# Patient Record
Sex: Male | Born: 1945 | Race: White | Hispanic: No | Marital: Married | Smoking: Never smoker
Health system: Southern US, Community
[De-identification: ages and names within clinical notes are randomized; demographics above are authoritative.]

## PROBLEM LIST (undated history)

## (undated) DIAGNOSIS — E785 Hyperlipidemia, unspecified: Secondary | ICD-10-CM

## (undated) DIAGNOSIS — I219 Acute myocardial infarction, unspecified: Secondary | ICD-10-CM

## (undated) DIAGNOSIS — E119 Type 2 diabetes mellitus without complications: Secondary | ICD-10-CM

## (undated) DIAGNOSIS — I10 Essential (primary) hypertension: Secondary | ICD-10-CM

## (undated) HISTORY — PX: CARDIAC CATHETERIZATION: SHX172

## (undated) HISTORY — PX: ANKLE SURGERY: SHX546

---

## 2004-10-03 ENCOUNTER — Ambulatory Visit: Payer: Self-pay

## 2011-03-23 ENCOUNTER — Inpatient Hospital Stay: Payer: Self-pay | Admitting: Family Medicine

## 2011-03-27 ENCOUNTER — Observation Stay: Payer: Self-pay | Admitting: Family Medicine

## 2011-04-15 ENCOUNTER — Emergency Department: Payer: Self-pay | Admitting: Emergency Medicine

## 2011-04-19 ENCOUNTER — Encounter: Payer: Self-pay | Admitting: Cardiology

## 2011-05-13 ENCOUNTER — Encounter: Payer: Self-pay | Admitting: Cardiology

## 2011-06-13 ENCOUNTER — Encounter: Payer: Self-pay | Admitting: Cardiology

## 2011-06-23 ENCOUNTER — Inpatient Hospital Stay: Payer: Self-pay | Admitting: Family Medicine

## 2011-06-23 LAB — COMPREHENSIVE METABOLIC PANEL
Albumin: 3.7 g/dL (ref 3.4–5.0)
Alkaline Phosphatase: 71 U/L (ref 50–136)
Anion Gap: 11 (ref 7–16)
BUN: 13 mg/dL (ref 7–18)
Calcium, Total: 8.8 mg/dL (ref 8.5–10.1)
Glucose: 126 mg/dL — ABNORMAL HIGH (ref 65–99)
Potassium: 3.7 mmol/L (ref 3.5–5.1)
SGOT(AST): 29 U/L (ref 15–37)
SGPT (ALT): 28 U/L
Sodium: 141 mmol/L (ref 136–145)
Total Protein: 6.5 g/dL (ref 6.4–8.2)

## 2011-06-23 LAB — CBC
HCT: 38.1 % — ABNORMAL LOW (ref 40.0–52.0)
HGB: 12.7 g/dL — ABNORMAL LOW (ref 13.0–18.0)
MCH: 29.4 pg (ref 26.0–34.0)
Platelet: 156 10*3/uL (ref 150–440)
RBC: 4.31 10*6/uL — ABNORMAL LOW (ref 4.40–5.90)
WBC: 5.8 10*3/uL (ref 3.8–10.6)

## 2011-06-23 LAB — CK TOTAL AND CKMB (NOT AT ARMC): CK, Total: 217 U/L (ref 35–232)

## 2011-06-23 LAB — TROPONIN I: Troponin-I: <0.02 ng/mL

## 2011-06-24 LAB — CBC WITH DIFFERENTIAL/PLATELET
Basophil %: 0.4 %
Eosinophil #: 0.1 10*3/uL (ref 0.0–0.7)
HCT: 35.8 % — ABNORMAL LOW (ref 40.0–52.0)
HGB: 12.1 g/dL — ABNORMAL LOW (ref 13.0–18.0)
Lymphocyte %: 17.9 %
MCH: 29.5 pg (ref 26.0–34.0)
MCHC: 33.7 g/dL (ref 32.0–36.0)
Monocyte #: 0.9 10*3/uL — ABNORMAL HIGH (ref 0.0–0.7)
Neutrophil #: 5.3 10*3/uL (ref 1.4–6.5)
Neutrophil %: 69.1 %
Platelet: 137 10*3/uL — ABNORMAL LOW (ref 150–440)
RBC: 4.09 10*6/uL — ABNORMAL LOW (ref 4.40–5.90)

## 2011-06-24 LAB — BASIC METABOLIC PANEL
Anion Gap: 10 (ref 7–16)
BUN: 12 mg/dL (ref 7–18)
Chloride: 103 mmol/L (ref 98–107)
Creatinine: 1.03 mg/dL (ref 0.60–1.30)
EGFR (Non-African Amer.): >60
Potassium: 3.8 mmol/L (ref 3.5–5.1)
Sodium: 140 mmol/L (ref 136–145)

## 2011-06-24 LAB — CK TOTAL AND CKMB (NOT AT ARMC)
CK, Total: 185 U/L (ref 35–232)
CK-MB: 1.3 ng/mL (ref 0.5–3.6)

## 2011-06-25 LAB — BASIC METABOLIC PANEL
Anion Gap: 9 (ref 7–16)
BUN: 7 mg/dL (ref 7–18)
Calcium, Total: 8.2 mg/dL — ABNORMAL LOW (ref 8.5–10.1)
Co2: 29 mmol/L (ref 21–32)
EGFR (African American): >60
EGFR (Non-African Amer.): >60
Glucose: 112 mg/dL — ABNORMAL HIGH (ref 65–99)
Osmolality: 280 (ref 275–301)
Potassium: 3.9 mmol/L (ref 3.5–5.1)
Sodium: 141 mmol/L (ref 136–145)

## 2011-06-25 LAB — HEMOGLOBIN: HGB: 12.3 g/dL — ABNORMAL LOW (ref 13.0–18.0)

## 2011-06-25 LAB — PLATELET COUNT: Platelet: 135 10*3/uL — ABNORMAL LOW (ref 150–440)

## 2013-05-27 IMAGING — US US CAROTID DUPLEX BILAT
1 series · 14 of 24 positions shown · non-contrast
Comparison: none

REASON FOR EXAM: syncope
COMMENTS:

[Series 1: us carotid duplex bilat · 0.08mm/px · 14 of 67 slices shown]
[im 1/67]
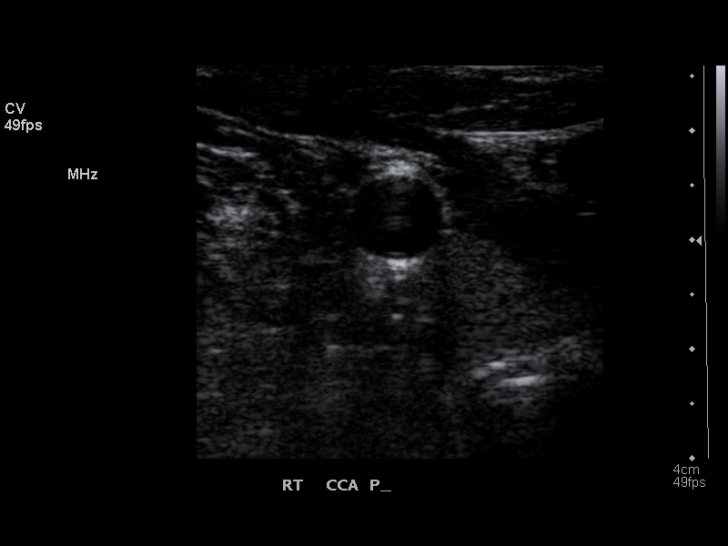
[im 6/67]
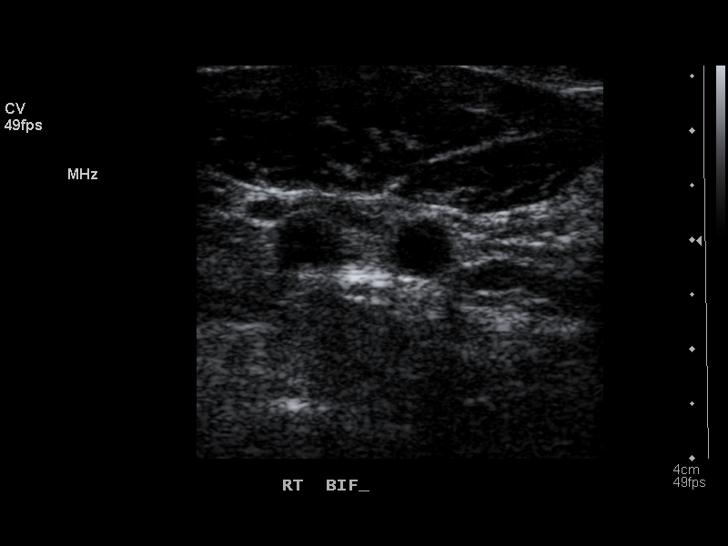
[im 12/67]
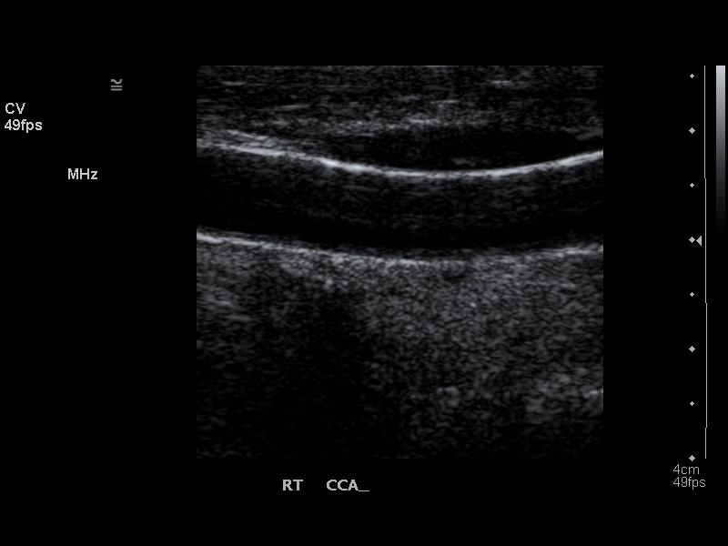
[im 18/67]
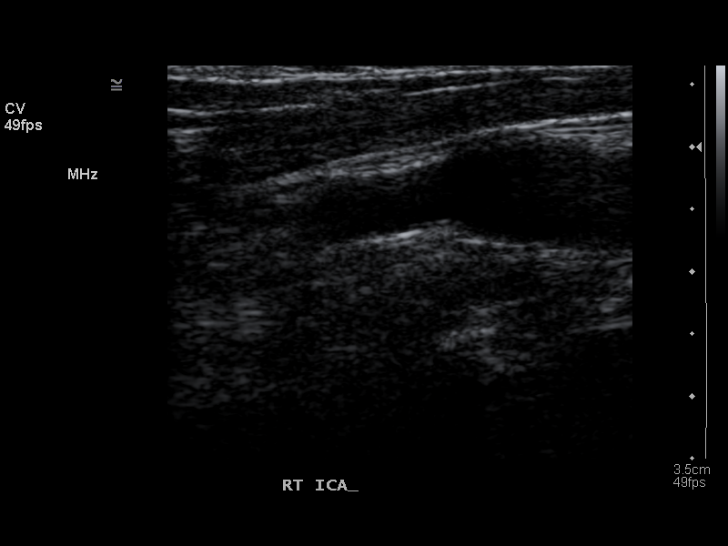
[im 21/67]
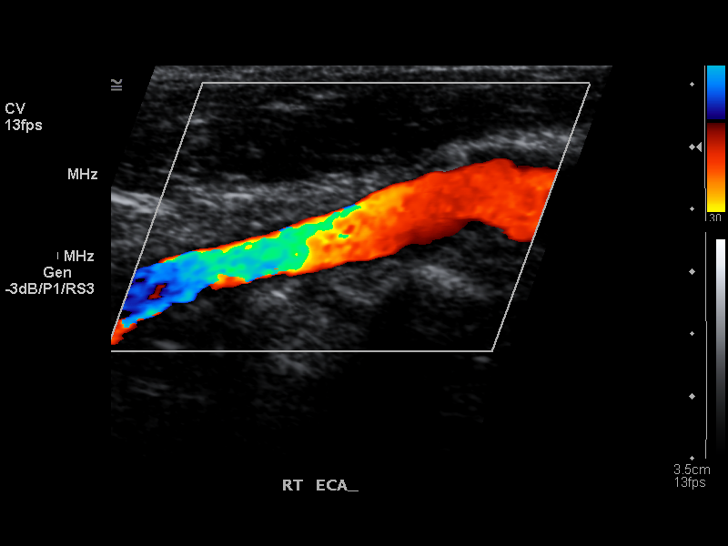
[im 26/67]
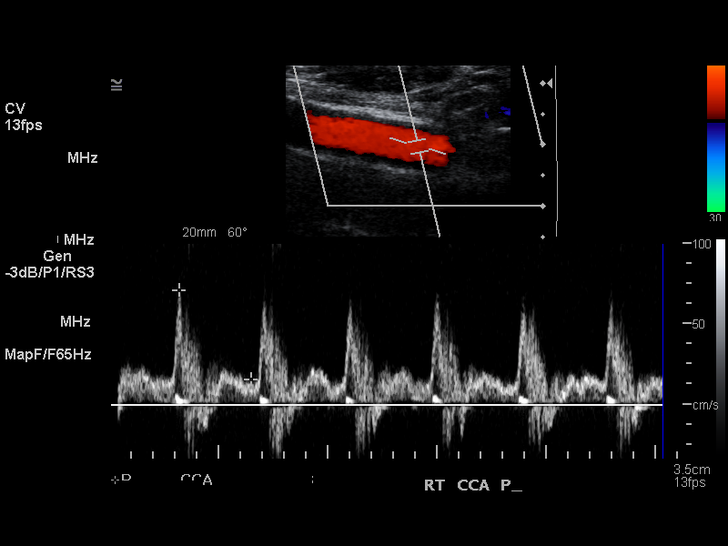
[im 32/67]
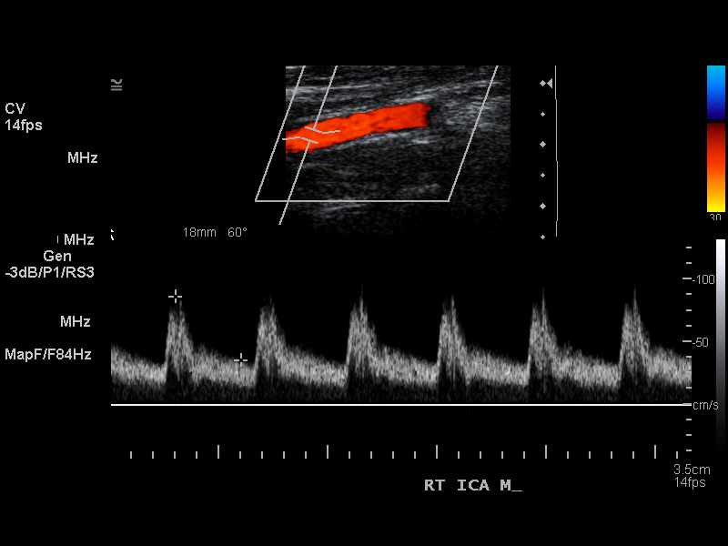
[im 35/67]
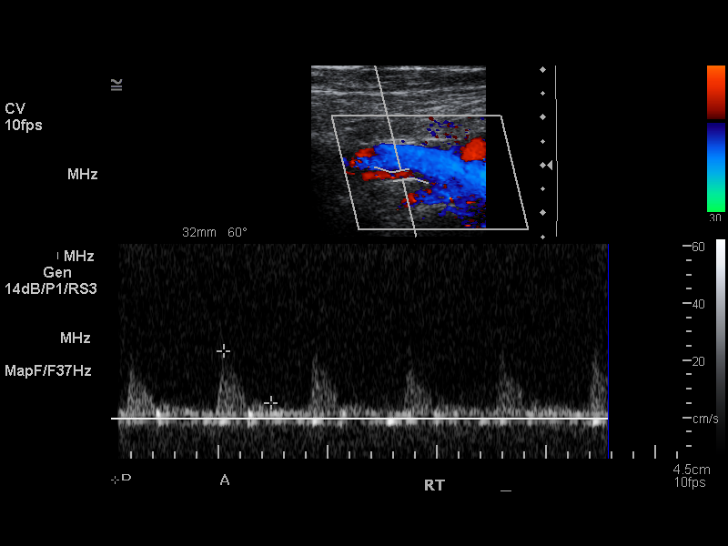
[im 41/67]
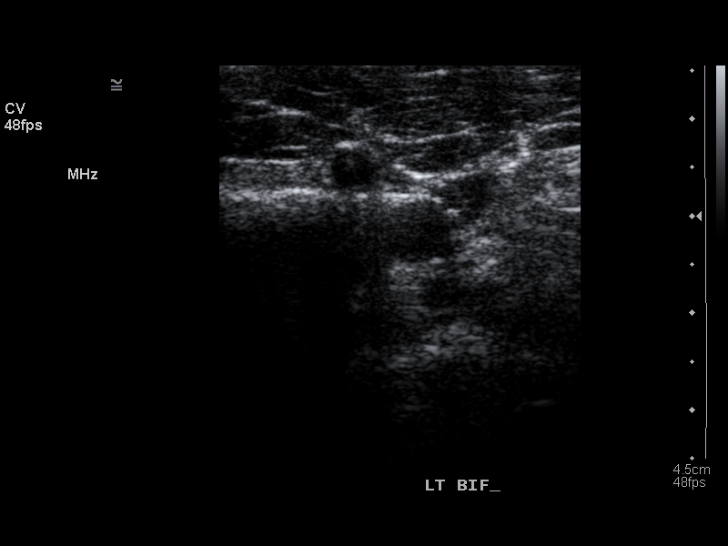
[im 46/67]
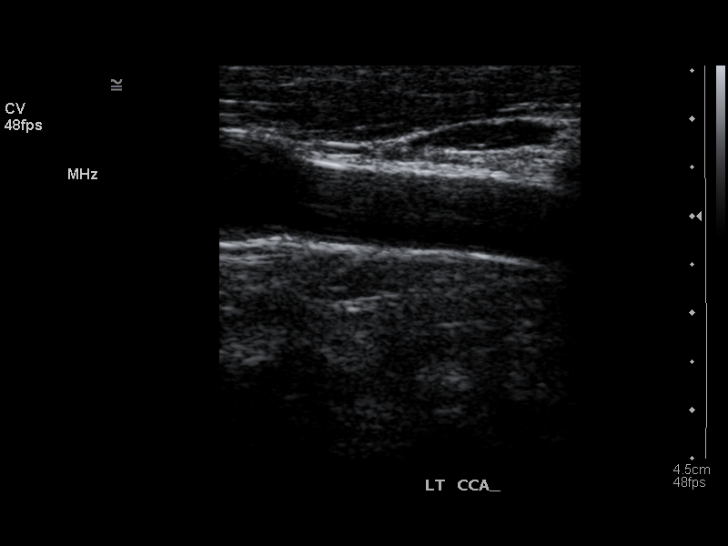
[im 52/67]
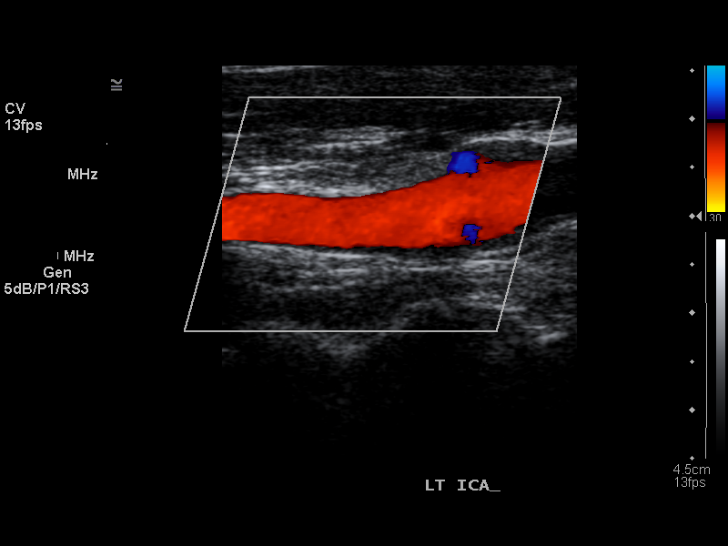
[im 55/67]
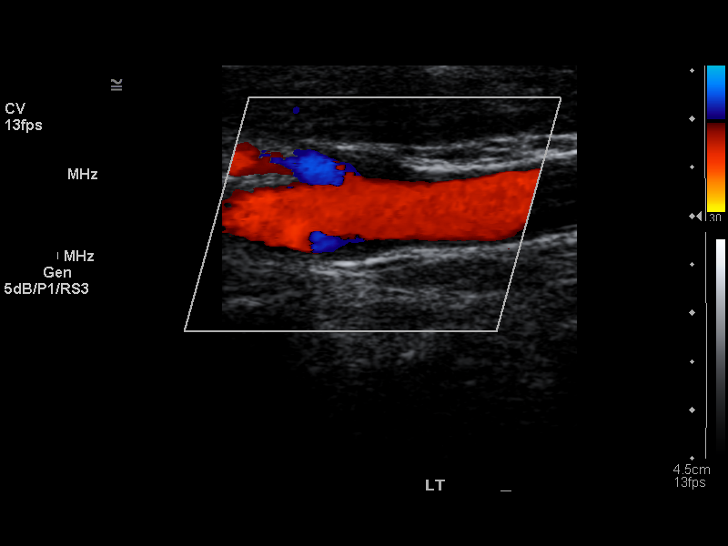
[im 61/67]
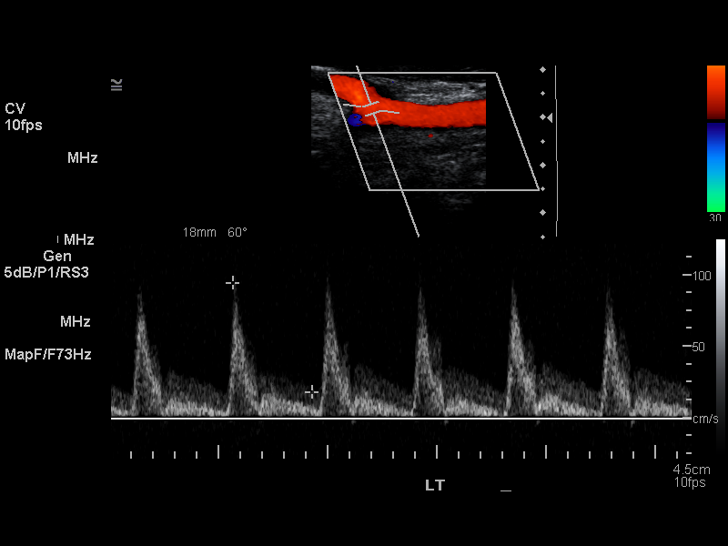
[im 67/67]
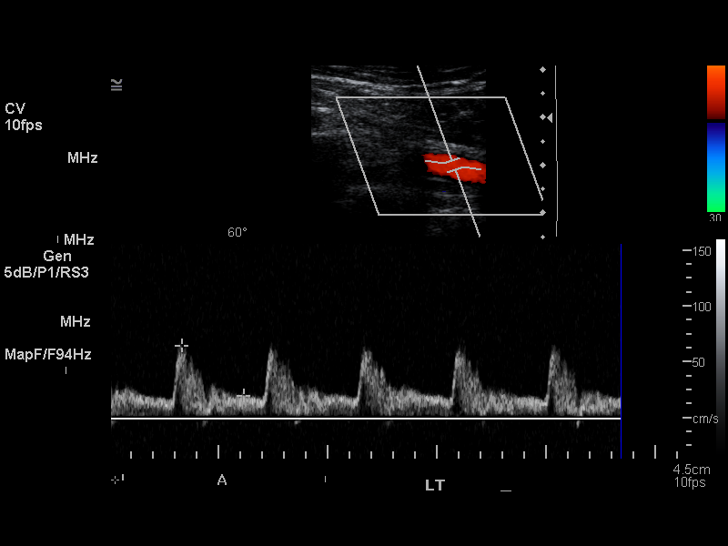

[14 of 24 positions shown; findings below may reference images not displayed]

PROCEDURE:     US  - US CAROTID DOPPLER BILATERAL  - June 23, 2011  [DATE]

RESULT:     There is observed slight soft plaque formation about the carotid
bifurcations bilaterally. On the right, the peak right common carotid artery
flow velocity measures 78.2 cm per second and the peak right internal
carotid artery flow velocity measures 91.8 cm per second. The ICA/CCA ratio
is 1.17. On the left, the peak left common carotid artery flow velocity
measures 86.7 cm per second and the peak left internal carotid artery flow
velocity measures 101.1 cm per second. The ICA/CCA ratio is 1.17. These
values bilaterally are consistent with the bilateral absence of
hemodynamically significant stenosis.

There is observed antegrade flow in both vertebrals.
IMPRESSION: 1. No hemodynamically significant stenosis is identified on either side.
2. There is antegrade flow in both vertebrals.

## 2013-05-27 IMAGING — CR DG CHEST 1V
1 series · 1 of 1 positions shown · non-contrast
Comparison: none

REASON FOR EXAM: SYNCOPE
COMMENTS:   May transport without cardiac monitor

[ap]
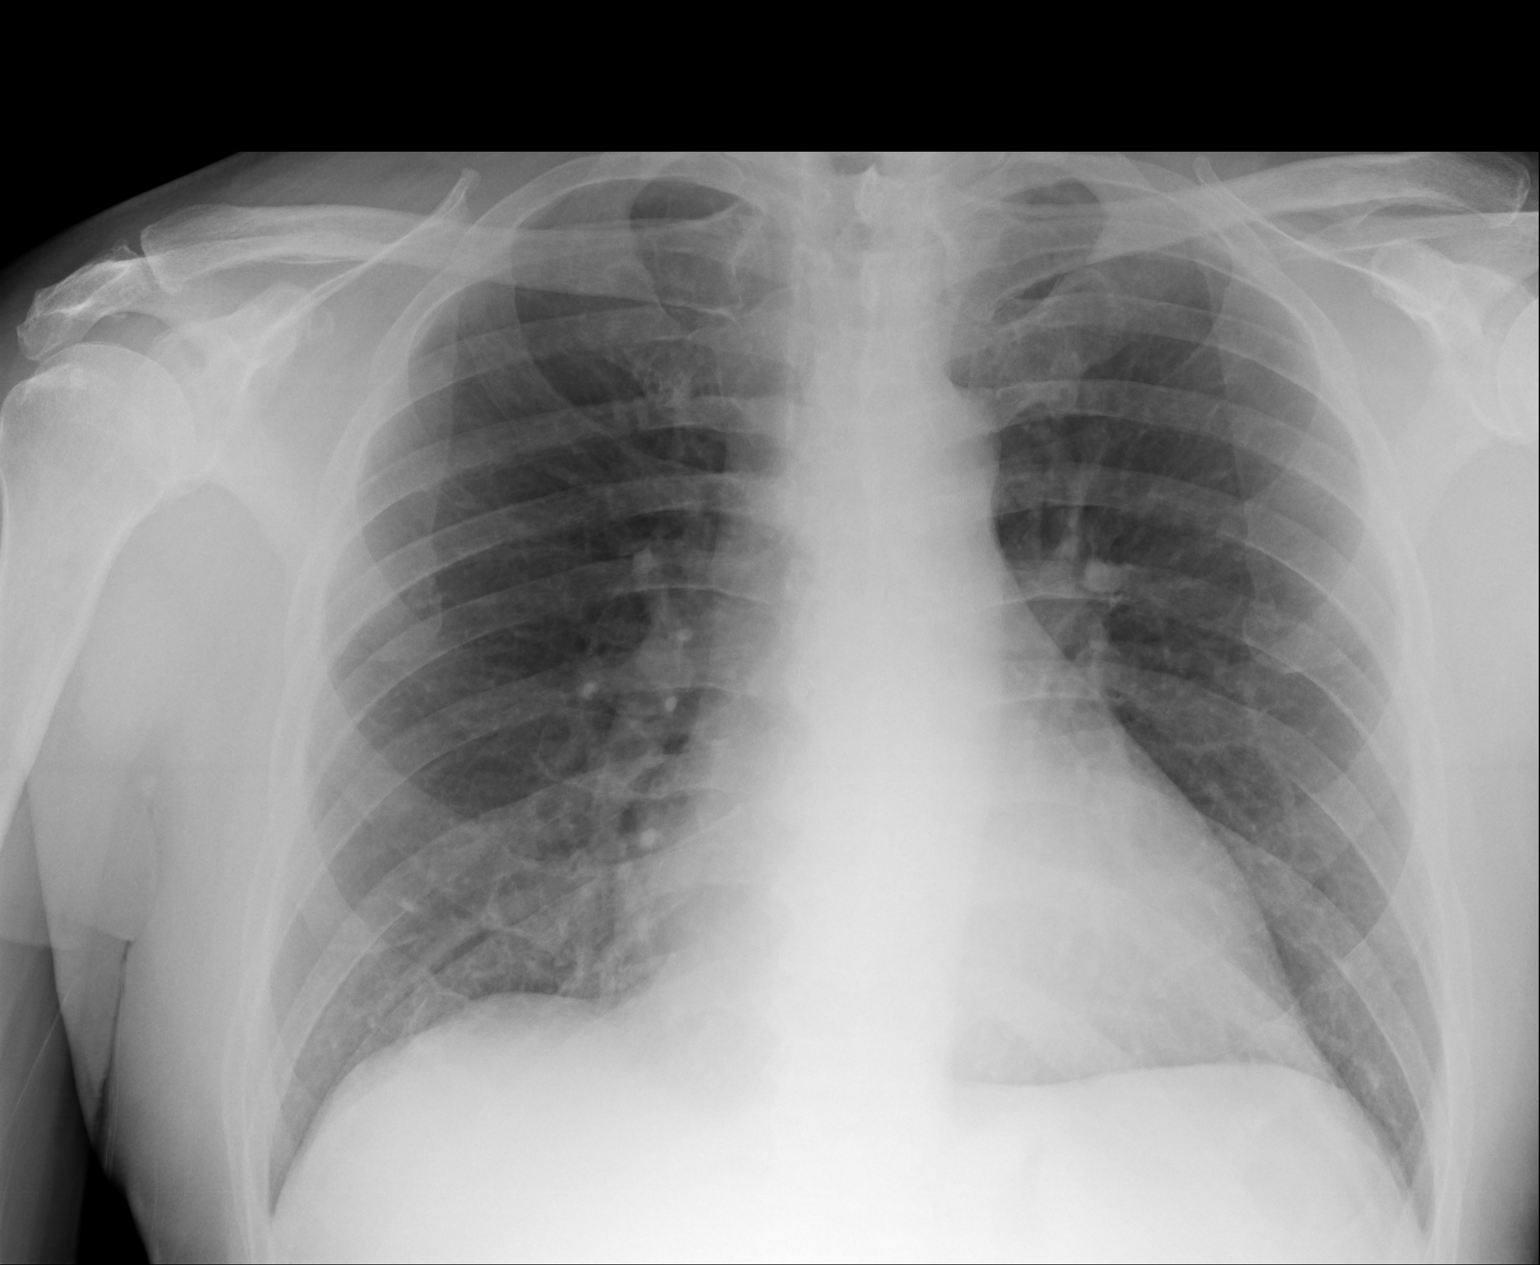

[1 of 1 positions shown; findings below may reference images not displayed]

PROCEDURE:     DXR - DXR CHEST 1 VIEWAP OR PA  - June 23, 2011  [DATE]

RESULT:     Comparison is made to the prior exam of 03/22/2011. The lung
fields are clear. No pneumonia, pneumothorax or pleural effusion is seen. No
acute changes of the heart or pulmonary vasculature are identified. The
heart is upper limits for normal in size or slightly enlarged but stable in
appearance as compared to the exam of 03/22/2011.
IMPRESSION: 1.     No acute changes are identified.

## 2014-10-04 NOTE — Consult Note (Signed)
PATIENT NAME:  Dillon [name], Dillon [name]  DATE OF CONSULTATION:  06/23/2011  REFERRING PHYSICIAN:  Katha HammingSnehalatha Konidena, MD CONSULTING PHYSICIAN:  Illene LabradorJames P. Angie FavaHooten Jr., MD  CHIEF COMPLAINT: Right ankle pain.   HISTORY OF PRESENT ILLNESS: The patient is a 69 year old male who arose to urinate earlier this morning and upon returning to bed became somewhat dizzy and nauseous and went back into the bathroom. He apparently sustained a syncopal episode. He does not recall tripping or falling. Upon regaining consciousness he noted right ankle pain and swelling. He was unable to stand or bear weight on the right lower extremity due to the ankle pain. He denied any other injuries. Radiographs obtained at [facility] emergency room demonstrated a displaced right lateral malleolar fracture.   The patient was admitted to the Medicine service to further evaluate the cause of the syncopal episode.   PAST MEDICAL HISTORY:  1. Hypertension. 2. Coronary artery disease. 3. Borderline diabetes. 4. 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   ADMISSION MEDICATIONS:  1. Aspirin 81 mg daily. 2. Lisinopril 10 mg daily.  3. Metoprolol 25 mg twice a day. 4. Effient 10 mg daily.  5. Nitroglycerin 0.4 mg p.o. every 5 minutes p.r.n. chest pain. 6. Paxil 20 mg daily.   ALLERGIES: Penicillin.   SOCIAL HISTORY: The patient denies any current tobacco or alcohol use. He is married.   FAMILY HISTORY: Noncontributory.   REVIEW OF SYSTEMS: Pertinent musculoskeletal review of systems is notable for right ankle pain. He denies any numbness or gross weakness.   PHYSICAL EXAMINATION:   GENERAL: The patient is a well-developed, well-nourished male seen in no acute distress.   HEENT: Atraumatic, normocephalic. Sclerae are clear. Oropharynx is clear. The patient is wearing a glasses.   NECK: Supple and nontender with good range of motion.   LUNGS: Clear to  auscultation bilaterally.   CARDIAC: Regular rate and rhythm with normal S1 and S2. No appreciable gallops or rubs.   ABDOMEN: Soft, nontender.   MUSCULOSKELETAL: Good range of motion, strength, and stability of the upper extremities. On examination of the right ankle, the splint is intact. There is tenderness along the lateral aspect of the ankle. Good capillary refill was noted.   NEUROLOGIC: Awake, alert, and oriented. Sensory function is intact. Motor strength is felt to be 5/5.   X-RAYS: Radiographs of the right ankle were reviewed. A displaced lateral malleolus fracture is noted.   IMPRESSION: Right lateral malleolus fracture.          PLAN: The findings were discussed in detail with the patient. Recommendation is made for open reduction and internal fixation of the right lateral malleolus fracture. The patient expressed his understanding of the risks and benefits and agreed with plans for surgical intervention.   The right leg was marked as per right-site surgery protocol.  ____________________________ Illene LabradorJames P. Angie FavaHooten Jr., MD jph:slb D: 06/24/2011 00:16:00 ET T: 06/24/2011 10:20:54 ET JOB#: [id number]  cc: Illene LabradorJames P. Angie FavaHooten Jr., MD, <Dictator> Illene LabradorJAMES P Angie FavaHOOTEN JR MD ELECTRONICALLY SIGNED 06/24/2011 14:38

## 2014-10-04 NOTE — Discharge Summary (Signed)
PATIENT NAME:  Dillon AshingSTACEY, Lemonte A MR#:  [id number] DATE OF BIRTH:  [birth year]  DATE OF ADMISSION:  06/23/2011 DATE OF DISCHARGE:  06/26/2011  DISCHARGE DIAGNOSES:  1. Syncope.  2. Right ankle fracture.  3. History of coronary artery disease.  4. Hypertension.  5. History of borderline diabetes.   DISCHARGE MEDICATIONS:  1. Aspirin 81 mg p.o. daily.  2. Lisinopril 10 mg p.o. daily.  3. Metoprolol 25 mg p.o. b.i.d.  4. Effient 10 mg p.o. daily.  5. Nitroglycerin 0.4 mg p.o. q.5 minutes p.r.n. for chest pain. 6. Ultram 50 mg 1 to 2 tabs q.6 hours as needed for pain.   CONSULT: Orthopedics.   PROCEDURES: Patient underwent ORIF of the right ankle status post fracture.   BRIEF HOSPITAL COURSE: Patient was initially admitted with syncopal episode, found to have an acute right ankle fracture. He was seen by orthopedics and underwent ORIF per Dr. Ernest PineHooten to repair the right ankle fracture. Patient was evaluated by physical therapy. He was discharged per orthopedics with instructions to follow up in 7 to 10 days. He was discharged undergo home physical therapy which was set up.   DISPOSITION: He is in stable condition to be discharged to home with home physical therapy.   FOLLOW UP: Follow up with orthopedics in 7 to 10 days. ____________________________ Marisue IvanKanhka Laverta Harnisch, MD kl:cms D: 07/25/2011 17:33:20 ET T: 07/25/2011 17:39:43 ET  JOB#: [id number] Marisue IvanKANHKA Dainelle Hun MD ELECTRONICALLY SIGNED 07/27/2011 7:31

## 2014-10-04 NOTE — H&P (Signed)
PATIENT NAME:  Dillon [name], Dillon [name] MR#:  [id number] DATE OF BIRTH:  10/18/1945  DATE OF ADMISSION:  06/23/2011  PRIMARY CARE PHYSICIAN: Dr. Burnadette Pop  CHIEF COMPLAINT: Syncope.   HISTORY OF PRESENT ILLNESS: Patient is a 69 year old male with history of hypertension, coronary artery disease and anxiety had an episode of syncope in the bathroom. Patient went to use the bathroom this morning around 5:00. After urination patient came back and sat on the bed. At that time he felt dizzy and nauseous so he went to the bathroom again and suddenly he did not know what happened and after some time wife heard thump in the bathroom and she went to check on him. He was lying on the floor and he was looking clammy. Patient says that he does not remember anything what happened and last thing he remembered was his ankle was turned outside and he was not able to move the ankle. Patient says that he felt a little dizzy and nauseous when he went to the bathroom second time, did not have any chest pain or palpitations. Patient right now is asymptomatic. I was asked to admit for syncope and right ankle fracture. Patient did suffer right ankle fracture, has right-sided fibular fracture in the lower part. I am admitting him for syncope.   PAST MEDICAL HISTORY:  1. Hypertension.  2. History of coronary artery disease. 3. History of borderline diabetes.   PAST SURGICAL HISTORY: Recent stent placement in October for non-ST-elevation myocardial infarction. Stent was placed by Dr. Darrold Junker. He had an LAD occlusion of 99%, ejection fraction was 58%.   MEDICATIONS:  1. Aspirin 81 mg daily. 2. Lisinopril 10 mg daily. 3. Metoprolol 25 mg p.o. b.i.d.  4. He was on Plavix before, now it is changed to Effient by cardiologist. He is on Effient 10 mg p.o. daily.  5. Nitroglycerin sublingual 0.4 mg p.o. every five minutes p.r.n. for chest pain.  6. Paxil 20 mg p.o. daily.   SOCIAL HISTORY: No smoking, no drinking, no drugs. He is a  retired Hotel manager person.   FAMILY HISTORY: Mother died at the age of 44 due to old age. Father is deceased.   REVIEW OF SYSTEMS: CONSTITUTIONAL: Has no fever or fatigue. EYES: No blurred vision. ENT: No tinnitus. No epistaxis. No difficulty swallowing. RESPIRATORY: No cough. No wheezing. CARDIOVASCULAR: No chest pain. No palpitations. Had syncope this morning.  GASTROINTESTINAL: He has some nausea, did not throw up. No gastroesophageal reflux disease. No rectal bleeding. GENITOURINARY: No dysuria or hematuria. ENDOCRINE: No polyuria but borderline diabetes. INTEGUMENT: No skin rashes. MUSCULOSKELETAL: Has pain in the right ankle, has a bandage present. NEUROLOGIC: No numbness or weakness. PSYCH: Has history of anxiety present.   PHYSICAL EXAMINATION:  VITAL SIGNS: Temperature 97.9, pulse 70, respirations 16, blood pressure 123/75, saturations 99% on room air.   GENERAL: Patient is alert, awake, oriented elderly male, not in distress, answering questions appropriately.   HEENT: Head atraumatic, normocephalic. Pupils are equally reacting to light. Extraocular movements are intact. Dentition is good.  NECK: Thyroid not enlarged, supple. No masses. No lymphadenopathy. No JVD. No carotid bruit.   RESPIRATORY: Bilateral breath sounds present. Clear to auscultation. No wheeze. No rales.   ABDOMEN: Soft, nontender, nondistended. Bowel sounds present.   EXTREMITIES: Patient has splint on the right ankle.   SKIN: No skin rashes.   NEUROLOGIC: Cranial nerves II through XII intact. No dysarthria or aphasia. Sensations are intact. Power 5/5 in upper extremities.    PSYCH: Oriented to  time, place, person.   LABORATORY, DIAGNOSTIC AND RADIOLOGICAL DATA: Electrolytes: Sodium 141, potassium 3.7, chloride 104, bicarbonate 26, BUN 13, creatinine 1.08, glucose 126. Liver functions are within normal limits.   WBC 5.8, hemoglobin 12.7, hematocrit 38.1, platelets 156. Troponin less than 0.02. EKG showed  normal sinus rhythm with left axis deviation. No ST-T changes. CT of the head showed no acute findings.   Ankle x-ray showed fibula fracture on the right side around the ankle. Official report is still pending. Reviewed the x-ray with the ER physician.   ASSESSMENT AND PLAN: 69 year old male with:  1. Syncope, likely vasovagal happened after micturition. Patient will be observed overnight. Check the orthostatic blood pressure and also check carotid ultrasound. He had a CT of the head which is negative and echocardiogram in October was normal. Patient's echo was normal, ejection fraction 58%. We are going to continue the aspirin and check this orthostatic vitals and check carotid ultrasound.  2. Right ankle fracture suffered because of syncope. Patient already has splint on the right ankle. Continue pain management and nausea medications with Zofran. Patient will be seen by Dr. Ernest PineHooten. Dr. Ernest PineHooten was informed by ER physician.  3. Hypertension and history of coronary artery disease with stent in October. Patient is on Effient. Will continue the Effient and aspirin along with beta blockers and ACE inhibitors and p.r.n. nitroglycerin for chest pain.  4. Anxiety. He is on Paxil, continue that.  5. Borderline diabetes with glucose 126. [facility] and that will be managed as an outpatient.   TIME SPENT ON HISTORY AND PHYSICAL: 60 minutes. Transfer the patient to Dr. Burnadette PopLinthavong' service.   ____________________________ Katha HammingSnehalatha Carmella Kees, MD sk:cms D: 06/23/2011 09:02:27 ET T: 06/23/2011 09:27:08 ET  JOB#: [id number] cc: Katha HammingSnehalatha Hattie Aguinaldo, MD, <Dictator> Marisue IvanKanhka Linthavong, MD Katha HammingSNEHALATHA Aslin Farinas MD ELECTRONICALLY SIGNED 06/25/2011 16:28

## 2014-10-04 NOTE — Consult Note (Signed)
Brief Consult Note: Diagnosis: Right lateral malleolus fracture.   Patient was seen by consultant.   Consult note dictated.   Comments: Recommend ORIF.  The risks and benefits of surgical intervention were discussed in detail with the patient. The patient expressed understanding of the risks and benefits and agreed with plans for surgery.  Surgical site signed as per "right site surgery" protocol.  Electronic Signatures: Donato HeinzHooten, James P (MD)  (Signed 12-Jan-13 00:07)  Authored: Brief Consult Note   Last Updated: 12-Jan-13 00:07 by Donato HeinzHooten, James P (MD)

## 2014-10-04 NOTE — Op Note (Signed)
PATIENT NAME:  Dillon [name], Dillon [name] MR#:  [id number] DATE OF BIRTH:  1945/06/26  DATE OF PROCEDURE:  06/24/2011  PREOPERATIVE DIAGNOSIS: Right lateral malleolus fracture.   POSTOPERATIVE DIAGNOSIS: Right lateral malleolus fracture.   PROCEDURE PERFORMED: Open reduction internal fixation of right lateral malleolus fracture.   SURGEON: Illene LabradorJames P. Angie FavaHooten, Jr., MD   ANESTHESIA: General.   ESTIMATED BLOOD LOSS: Minimal.   FLUIDS REPLACED: 1000 mL of Crystalloid.   TOURNIQUET TIME: 59 minutes.   DRAINS: None.   IMPLANTS UTILIZED: Synthes six hole one-third tubular plate, five 3.5 mm cortical screws, and one 4.0 mm fully threaded cancellous screw.   INDICATIONS FOR SURGERY: The patient is [name] 69 year old male who apparently had [name] syncopal episode at home and fell. [facility] Room demonstrated [name] displaced right lateral malleolar fracture. The patient was evaluated for the syncopal episode and subsequently cleared for surgery. Risks and benefits of surgical intervention were discussed in detail with the patient. He expressed his understanding of the risks and benefits and agreed with plans for surgical intervention.   PROCEDURE IN DETAIL: The patient was brought into the operating room and, after adequate general anesthesia was achieved, [name] tourniquet was placed on the patient's upper right thigh. The patient's right foot and ankle and lower leg were cleaned and prepped with alcohol and DuraPrep and draped in the usual sterile fashion. [name] "time-out" was performed as per usual protocol. The right lower extremity was exsanguinated using an Esmarch and the tourniquet was inflated to 300 mmHg. [name] longitudinal incision was made over the distal fibula. Dissection was carried down to the fracture site. [name] hematoma was evacuated from the fracture site. Soft tissue was carefully debrided from the fracture edges. The fracture was reduced and provisionally maintained using bone reduction forceps with  points. [name] six hole one-third tubular plate was contoured to fit along the lateral aspect of the distal fibula. The plate was then secured using [name] total of five 3.5 mm cortical screws and one 1.4 mm fully threaded cancellus screw. Excellent reduction was appreciated and good restoration of the ankle mortise was appreciated as noted on FluoroScan images. Wound was irrigated with copious amounts of normal saline with antibiotic solution. Wound was closed in layers using first #0 Vicryl followed by #2-0 Vicryl. Skin was closed with skin staples. 10 mL of 0.25% Marcaine was injected along the incision site. Sterile dressing was applied followed by application of [name] posterior splint. Tourniquet was deflated after total tourniquet time of 59 minutes.   The patient tolerated the procedure well. He was transported to the recovery room in stable condition.    ____________________________ Illene LabradorJames P. Angie FavaHooten Jr., MD jph:drc D: 06/24/2011 15:45:36 ET T: 06/24/2011 15:58:38 ET JOB#: [id number]  cc: Illene LabradorJames P. Angie FavaHooten Jr., MD, <Dictator> JAMES P Angie FavaHOOTEN JR MD ELECTRONICALLY SIGNED 06/30/2011 6:38

## 2015-09-06 ENCOUNTER — Ambulatory Visit: Payer: Medicare Other | Admitting: Anesthesiology

## 2015-09-06 ENCOUNTER — Encounter: Payer: Self-pay | Admitting: *Deleted

## 2015-09-06 ENCOUNTER — Encounter
Admission: RE | Disposition: A | Discharge status: Home / Self Care | Payer: Self-pay | Source: Ambulatory Visit | Attending: Gastroenterology

## 2015-09-06 ENCOUNTER — Ambulatory Visit
Admission: RE | Admit: 2015-09-06 | Discharge: 2015-09-06 | Disposition: A | Discharge status: Home / Self Care | Payer: Medicare Other | Source: Ambulatory Visit | Attending: Gastroenterology | Admitting: Gastroenterology

## 2015-09-06 DIAGNOSIS — Z1211 Encounter for screening for malignant neoplasm of colon: Secondary | ICD-10-CM | POA: Diagnosis not present

## 2015-09-06 DIAGNOSIS — I10 Essential (primary) hypertension: Secondary | ICD-10-CM | POA: Diagnosis not present

## 2015-09-06 DIAGNOSIS — K5730 Diverticulosis of large intestine without perforation or abscess without bleeding: Secondary | ICD-10-CM | POA: Diagnosis not present

## 2015-09-06 DIAGNOSIS — E785 Hyperlipidemia, unspecified: Secondary | ICD-10-CM | POA: Diagnosis not present

## 2015-09-06 DIAGNOSIS — Z79899 Other long term (current) drug therapy: Secondary | ICD-10-CM | POA: Insufficient documentation

## 2015-09-06 DIAGNOSIS — I252 Old myocardial infarction: Secondary | ICD-10-CM | POA: Insufficient documentation

## 2015-09-06 DIAGNOSIS — E119 Type 2 diabetes mellitus without complications: Secondary | ICD-10-CM | POA: Insufficient documentation

## 2015-09-06 DIAGNOSIS — Z7982 Long term (current) use of aspirin: Secondary | ICD-10-CM | POA: Insufficient documentation

## 2015-09-06 HISTORY — DX: Hyperlipidemia, unspecified: E78.5

## 2015-09-06 HISTORY — DX: Essential (primary) hypertension: I10

## 2015-09-06 HISTORY — DX: Acute myocardial infarction, unspecified: I21.9

## 2015-09-06 HISTORY — DX: Type 2 diabetes mellitus without complications: E11.9

## 2015-09-06 LAB — GLUCOSE, CAPILLARY: GLUCOSE-CAPILLARY: 126 mg/dL — AB (ref 65–99)

## 2015-09-06 SURGERY — COLONOSCOPY WITH PROPOFOL
Anesthesia: General

## 2015-09-06 MED ORDER — SODIUM CHLORIDE 0.9 % IV SOLN
INTRAVENOUS | Status: DC
  Administered 2015-09-06: 08:00:00 via INTRAVENOUS

## 2015-09-06 MED ORDER — PROPOFOL 500 MG/50ML IV EMUL
INTRAVENOUS | Status: DC | PRN
  Administered 2015-09-06: 80 ug/kg/min via INTRAVENOUS

## 2015-09-06 MED ORDER — SODIUM CHLORIDE 0.9 % IV SOLN: INTRAVENOUS | Status: DC

## 2015-09-06 MED ORDER — FENTANYL CITRATE (PF) 100 MCG/2ML IJ SOLN
INTRAMUSCULAR | Status: DC | PRN
  Administered 2015-09-06: 50 ug via INTRAVENOUS

## 2015-09-06 MED ORDER — PROPOFOL 10 MG/ML IV BOLUS
INTRAVENOUS | Status: DC | PRN
  Administered 2015-09-06: 20 mg via INTRAVENOUS

## 2015-09-06 MED ORDER — MIDAZOLAM HCL 2 MG/2ML IJ SOLN
INTRAMUSCULAR | Status: DC | PRN
  Administered 2015-09-06: 1 mg via INTRAVENOUS

## 2015-09-06 NOTE — Transfer of Care (Signed)
Immediate Anesthesia Transfer of Care Note  Patient: Dillon [name]  Procedure(s) Performed: Procedure(s): COLONOSCOPY WITH PROPOFOL (N/A)  Patient Location: PACU  Anesthesia Type:General  Level of Consciousness: awake, alert  and oriented  Airway & Oxygen Therapy: Patient Spontanous Breathing and Patient connected to nasal cannula oxygen  Post-op Assessment: Report given to RN and Post -op Vital signs reviewed and stable  Post vital signs: Reviewed and stable  Last Vitals:  Filed Vitals:   09/06/15 0742  BP: 133/87  Pulse: 62  Temp: 37.1 C  Resp: 12    Complications: No apparent anesthesia complications

## 2015-09-06 NOTE — Op Note (Signed)
[facility] Gastroenterology Patient Name: Dillon [name] Procedure Date: 09/06/2015 8:38 AM MRN: [id number] Account #: [id number] Date of Birth: [birth year] Admit Type: Outpatient Age: 70 Room: [facility] ENDO ROOM 3 Gender: Male Note Status: Finalized Procedure:            Colonoscopy Indications:          Screening for colorectal malignant neoplasm Providers:            Christena Deem, MD Referring MD:         Marisue Ivan (Referring MD) Medicines:            Monitored Anesthesia Care Complications:        No immediate complications. Procedure:            Pre-Anesthesia Assessment:                       - ASA Grade Assessment: II - A patient with mild                        systemic disease.                       After obtaining informed consent, the colonoscope was                        passed under direct vision. Throughout the procedure,                        the patient's blood pressure, pulse, and oxygen                        saturations were monitored continuously. The                        Colonoscope was introduced through the anus with the                        intention of advancing to the cecum. The scope was                        advanced to the sigmoid colon before the procedure was                        aborted. Medications were given. The colonoscopy was                        unusually difficult due to poor bowel prep with stool                        present. The patient tolerated the procedure well. The                        quality of the bowel preparation was poor. Findings:      Many small and large-mouthed diverticula were found in the sigmoid colon.      A moderate amount of semi-solid solid stool was found in the sigmoid       colon, precluding visualization.      The digital rectal exam was normal. Impression:           - Preparation of the colon was poor.                       -  Diverticulosis in the sigmoid colon.               - Stool in the sigmoid colon.                       - No specimens collected. Recommendation:       - Put patient on a clear liquid diet starting today. Procedure Code(s):    --- Professional ---                       [id number], 53, Colonoscopy, flexible; diagnostic, including                        collection of specimen(s) by brushing or washing, when                        performed (separate procedure) Diagnosis Code(s):    --- Professional ---                       Z12.11, Encounter for screening for malignant neoplasm                        of colon                       K57.30, Diverticulosis of large intestine without                        perforation or abscess without bleeding CPT copyright 2016 [employer]. All rights reserved. The codes documented in this report are preliminary and upon coder review may  be revised to meet current compliance requirements. Christena DeemMartin U Saddie Sandeen, MD 09/06/2015 8:58:18 AM This report has been signed electronically. Number of Addenda: 0 Note Initiated On: 09/06/2015 8:38 AM Total Procedure Duration: 0 hours 7 minutes 17 seconds       [facility]

## 2015-09-06 NOTE — Op Note (Signed)
Patient was not cleaned out for procedure,  Procedure cancelled by Dr. Marva PandaSkulskie.  To be rescheduled.

## 2015-09-06 NOTE — Anesthesia Preprocedure Evaluation (Signed)
Anesthesia Evaluation  Patient identified by MRN, date of birth, ID band Patient awake    Reviewed: Allergy & Precautions, NPO status , Patient's Chart, lab work & pertinent test results  History of Anesthesia Complications Negative for: history of anesthetic complications  Airway Mallampati: II       Dental   Pulmonary neg pulmonary ROS,           Cardiovascular hypertension, Pt. on medications and Pt. on home beta blockers      Neuro/Psych Depression negative neurological ROS     GI/Hepatic negative GI ROS, Neg liver ROS,   Endo/Other  diabetes (Borderline)  Renal/GU negative Renal ROS     Musculoskeletal   Abdominal   Peds  Hematology  (+) Blood dyscrasia, ,   Anesthesia Other Findings   Reproductive/Obstetrics                             Anesthesia Physical Anesthesia Plan  ASA: II  Anesthesia Plan: General   Post-op Pain Management:    Induction: Intravenous  Airway Management Planned: Nasal Cannula  Additional Equipment:   Intra-op Plan:   Post-operative Plan:   Informed Consent: I have reviewed the patients History and Physical, chart, labs and discussed the procedure including the risks, benefits and alternatives for the proposed anesthesia with the patient or authorized representative who has indicated his/her understanding and acceptance.     Plan Discussed with:   Anesthesia Plan Comments:         Anesthesia Quick Evaluation

## 2015-09-06 NOTE — Anesthesia Postprocedure Evaluation (Signed)
Anesthesia Post Note  Patient: Dillon [name]  Procedure(s) Performed: * No procedures listed *  Patient location during evaluation: Endoscopy Anesthesia Type: General Level of consciousness: awake and alert Pain management: pain level controlled Vital Signs Assessment: post-procedure vital signs reviewed and stable Respiratory status: spontaneous breathing and respiratory function stable Cardiovascular status: stable Anesthetic complications: no    Last Vitals: There were no vitals filed for this visit.  Last Pain: There were no vitals filed for this visit.               KEPHART,WILLIAM K

## 2015-09-06 NOTE — H&P (Signed)
Outpatient short stay form Pre-procedure 09/06/2015 8:34 AM Dillon [name] Kavin Weckwerth MD  Primary Physician: Dr Jonn ShinglesKankha [name]  Reason for visit:  Colonoscopy  History of present illness:  Patient is a 70 year old male presenting today for colonoscopy. His last colonoscopy was about 10 years ago with a finding of a single hyperplastic. He tolerated his prep well. He does take a daily aspirin but he held that since beginning yesterday. He takes no other aspirin or blood thinning products.    Current facility-administered medications:  .  0.9 %  sodium chloride infusion, , Intravenous, Continuous, Dillon [name] Chanette Demo, MD, Last Rate: 20 mL/hr at 09/06/15 0803 .  0.9 %  sodium chloride infusion, , Intravenous, Continuous, Dillon [name] Mercer Peifer, MD  Prescriptions prior to admission  Medication Sig Dispense Refill Last Dose  . aspirin 81 MG tablet Take 81 mg by mouth daily.   09/05/2015  . lisinopril (PRINIVIL,ZESTRIL) 10 MG tablet Take 10 mg by mouth daily.   09/06/2015 at 0600  . lovastatin (MEVACOR) 10 MG tablet Take 10 mg by mouth at bedtime.   09/05/2015 at Unknown time  . metoprolol tartrate (LOPRESSOR) 25 MG tablet Take 25 mg by mouth 2 (two) times daily.   09/06/2015 at 0600  . nitroGLYCERIN (NITROSTAT) 0.4 MG SL tablet Place under the tongue every 5 (five) minutes as needed for chest pain.     Marland Kitchen. PARoxetine (PAXIL) 20 MG tablet Take 20 mg by mouth daily.   09/05/2015 at Unknown time     Allergies  Allergen Reactions  . Penicillins   . Plavix [Clopidogrel Bisulfate]      Past Medical History  Diagnosis Date  . Hypertension   . Diabetes mellitus without complication (HCC)     Borderline but no medications  . Hyperlipidemia   . Myocardial infarction [facility](HCC)     2012    Review of systems:      Physical Exam    Heart and lungs: Regular rate and rhythm without rub or gallop, lungs are bilaterally clear.    HEENT: Normocephalic atraumatic eyes are anicteric    Other:     Pertinant exam  for procedure: Soft nontender nondistended bowel sounds positive normoactive    Planned proceedures: Colonoscopy and indicated procedures. I have discussed the risks benefits and complications of procedures to include not limited to bleeding, infection, perforation and the risk of sedation and the patient wishes to proceed.    Dillon [name] Dillon Gutridge, MD Gastroenterology 09/06/2015  8:34 AM

## 2015-09-07 ENCOUNTER — Ambulatory Visit
Admission: RE | Admit: 2015-09-07 | Discharge: 2015-09-07 | Disposition: A | Discharge status: Home / Self Care | Payer: Medicare Other | Source: Ambulatory Visit | Attending: Gastroenterology | Admitting: Gastroenterology

## 2015-09-07 ENCOUNTER — Encounter
Admission: RE | Disposition: A | Discharge status: Home / Self Care | Payer: Self-pay | Source: Ambulatory Visit | Attending: Gastroenterology

## 2015-09-07 ENCOUNTER — Encounter: Payer: Self-pay | Admitting: *Deleted

## 2015-09-07 ENCOUNTER — Ambulatory Visit: Payer: Medicare Other | Admitting: Anesthesiology

## 2015-09-07 DIAGNOSIS — Z79899 Other long term (current) drug therapy: Secondary | ICD-10-CM | POA: Diagnosis not present

## 2015-09-07 DIAGNOSIS — K5730 Diverticulosis of large intestine without perforation or abscess without bleeding: Secondary | ICD-10-CM | POA: Diagnosis not present

## 2015-09-07 DIAGNOSIS — E785 Hyperlipidemia, unspecified: Secondary | ICD-10-CM | POA: Diagnosis not present

## 2015-09-07 DIAGNOSIS — E119 Type 2 diabetes mellitus without complications: Secondary | ICD-10-CM | POA: Diagnosis not present

## 2015-09-07 DIAGNOSIS — Z880 Allergy status to penicillin: Secondary | ICD-10-CM | POA: Diagnosis not present

## 2015-09-07 DIAGNOSIS — I252 Old myocardial infarction: Secondary | ICD-10-CM | POA: Diagnosis not present

## 2015-09-07 DIAGNOSIS — Z888 Allergy status to other drugs, medicaments and biological substances status: Secondary | ICD-10-CM | POA: Insufficient documentation

## 2015-09-07 DIAGNOSIS — I10 Essential (primary) hypertension: Secondary | ICD-10-CM | POA: Insufficient documentation

## 2015-09-07 DIAGNOSIS — Z1211 Encounter for screening for malignant neoplasm of colon: Secondary | ICD-10-CM | POA: Diagnosis not present

## 2015-09-07 HISTORY — PX: COLONOSCOPY WITH PROPOFOL: SHX5780

## 2015-09-07 SURGERY — COLONOSCOPY WITH PROPOFOL
Anesthesia: General

## 2015-09-07 MED ORDER — LIDOCAINE HCL (CARDIAC) 20 MG/ML IV SOLN
INTRAVENOUS | Status: DC | PRN
  Administered 2015-09-07: 100 mg via INTRAVENOUS

## 2015-09-07 MED ORDER — SODIUM CHLORIDE 0.9 % IV SOLN
INTRAVENOUS | Status: DC
  Administered 2015-09-07: 1000 mL via INTRAVENOUS

## 2015-09-07 MED ORDER — PROPOFOL 500 MG/50ML IV EMUL
INTRAVENOUS | Status: DC | PRN
  Administered 2015-09-07: 100 ug/kg/min via INTRAVENOUS

## 2015-09-07 MED ORDER — FENTANYL CITRATE (PF) 100 MCG/2ML IJ SOLN
INTRAMUSCULAR | Status: DC | PRN
  Administered 2015-09-07: 50 ug via INTRAVENOUS

## 2015-09-07 MED ORDER — SODIUM CHLORIDE 0.9 % IV SOLN: INTRAVENOUS | Status: DC

## 2015-09-07 MED ORDER — PHENYLEPHRINE HCL 10 MG/ML IJ SOLN
INTRAMUSCULAR | Status: DC | PRN
  Administered 2015-09-07: 100 ug via INTRAVENOUS

## 2015-09-07 MED ORDER — PROPOFOL 10 MG/ML IV BOLUS
INTRAVENOUS | Status: DC | PRN
  Administered 2015-09-07: 80 mg via INTRAVENOUS

## 2015-09-07 MED ORDER — MIDAZOLAM HCL 5 MG/5ML IJ SOLN
INTRAMUSCULAR | Status: DC | PRN
  Administered 2015-09-07: 1 mg via INTRAVENOUS

## 2015-09-07 NOTE — Anesthesia Postprocedure Evaluation (Signed)
Anesthesia Post Note  Patient: Dillon [name]  Procedure(s) Performed: Procedure(s) (LRB): COLONOSCOPY WITH PROPOFOL (N/A)  Patient location during evaluation: PACU Anesthesia Type: General Level of consciousness: awake and alert and oriented Pain management: pain level controlled Vital Signs Assessment: post-procedure vital signs reviewed and stable Respiratory status: spontaneous breathing Cardiovascular status: blood pressure returned to baseline Anesthetic complications: no    Last Vitals:  Filed Vitals:   09/07/15 1710 09/07/15 1720  BP: 96/57 114/72  Pulse: 65 68  Temp:    Resp: 14 12    Last Pain: There were no vitals filed for this visit.               Anarely Nicholls

## 2015-09-07 NOTE — Transfer of Care (Signed)
Immediate Anesthesia Transfer of Care Note  Patient: Dillon [name]  Procedure(s) Performed: Procedure(s): COLONOSCOPY WITH PROPOFOL (N/A)  Patient Location: PACU  Anesthesia Type:General  Level of Consciousness: sedated  Airway & Oxygen Therapy: Patient Spontanous Breathing and Patient connected to nasal cannula oxygen  Post-op Assessment: Report given to RN  Post vital signs: Reviewed and stable  Last Vitals:  Filed Vitals:   09/07/15 1410 09/07/15 1700  BP: 149/86 94/53  Pulse: 73 62  Temp: 37.2 C 97.38F  Resp: 20 14    Complications: No apparent anesthesia complications

## 2015-09-07 NOTE — H&P (Signed)
Outpatient short stay form Pre-procedure 09/07/2015 4:29 PM Christena DeemMartin U Amadu Schlageter MD  Primary Physician: dr Jonn ShinglesKankha Linthavong  Reason for visit:  Colonoscopy  History of present illness:  Patient is a 70 year old male presenting today for colonoscopy. He did come in yesterday for EGD and colonoscopy. The EGD was completed however his prep was not good enough for colonoscopy. He does take a daily aspirin but has held that for several days now. He takes no other aspirin or blood thinning products. He tolerated repeat prep well.    Current facility-administered medications:  .  0.9 %  sodium chloride infusion, , Intravenous, Continuous, Christena DeemMartin U Jamine Highfill, MD, Last Rate: 20 mL/hr at 09/07/15 1424, 1,000 mL at 09/07/15 1424 .  0.9 %  sodium chloride infusion, , Intravenous, Continuous, Christena DeemMartin U Anasophia Pecor, MD .  0.9 %  sodium chloride infusion, , Intravenous, Continuous, Christena DeemMartin U Jermika Olden, MD  Prescriptions prior to admission  Medication Sig Dispense Refill Last Dose  . lisinopril (PRINIVIL,ZESTRIL) 10 MG tablet Take 10 mg by mouth daily.   09/07/2015 at 0845  . metoprolol tartrate (LOPRESSOR) 25 MG tablet Take 25 mg by mouth 2 (two) times daily.   09/07/2015 at 0845  . aspirin 81 MG tablet Take 81 mg by mouth daily.   09/05/2015  . lovastatin (MEVACOR) 10 MG tablet Take 10 mg by mouth at bedtime.   09/05/2015 at Unknown time  . nitroGLYCERIN (NITROSTAT) 0.4 MG SL tablet Place under the tongue every 5 (five) minutes as needed for chest pain.     Marland Kitchen. PARoxetine (PAXIL) 20 MG tablet Take 20 mg by mouth daily.   09/05/2015 at Unknown time     Allergies  Allergen Reactions  . Penicillins   . Plavix [Clopidogrel Bisulfate]      Past Medical History  Diagnosis Date  . Hypertension   . Diabetes mellitus without complication (HCC)     Borderline but no medications  . Hyperlipidemia   . Myocardial infarction [facility](HCC)     2012    Review of systems:      Physical Exam    Heart and lungs: Regular  rate and rhythm without rub or gallop, lungs are bilaterally clear.    HEENT: Normocephalic atraumatic eyes are anicteric    Other:     Pertinant exam for procedure: Off nontender nondistended bowel sounds positive normoactive.    Planned proceedures: Anoscopy and indicated procedures. I have discussed the risks benefits and complications of procedures to include not limited to bleeding, infection, perforation and the risk of sedation and the patient wishes to proceed.    Christena DeemMartin U Janequa Kipnis, MD Gastroenterology 09/07/2015  4:29 PM

## 2015-09-07 NOTE — Op Note (Signed)
[facility] Gastroenterology Patient Name: Dillon NorrieHoward [name] Procedure Date: 09/07/2015 4:27 PM MRN: [id number] Account #: [id number] Date of Birth: 01-14-1946 Admit Type: Outpatient Age: [age] [facility] Gender: Male Note Status: Finalized Procedure:            Colonoscopy Indications:          Screening for colorectal malignant neoplasm Providers:            Christena DeemMartin U. Fanta Wimberley, MD Referring MD:         Marisue IvanKanhka Linthavong (Referring MD) Medicines:            Monitored Anesthesia Care Complications:        No immediate complications. Procedure:            Pre-Anesthesia Assessment:                       - ASA Grade Assessment: III - A patient with severe                        systemic disease.                       After obtaining informed consent, the colonoscope was                        passed under direct vision. Throughout the procedure,                        the patient's blood pressure, pulse, and oxygen                        saturations were monitored continuously. The                        Colonoscope was introduced through the anus and                        advanced to the the cecum, identified by appendiceal                        orifice and ileocecal valve. The colonoscopy was                        performed without difficulty. The patient tolerated the                        procedure well. The quality of the bowel preparation                        was good. Findings:      Multiple small and large-mouthed diverticula were found in the sigmoid       colon, descending colon, splenic flexure, transverse colon, hepatic       flexure and ascending colon.      The retroflexed view of the distal rectum and anal verge was normal and       showed no anal or rectal abnormalities.      The digital rectal exam was normal. Impression:           - Diverticulosis in the sigmoid colon, in the  descending colon, at the splenic  flexure, in the                        transverse colon, at the hepatic flexure and in the                        ascending colon.                       - The distal rectum and anal verge are normal on                        retroflexion view.                       - No specimens collected. Recommendation:       - Discharge patient to home.                       - Repeat colonoscopy in 10 years for screening purposes. Procedure Code(s):    --- Professional ---                       [phone], Colonoscopy, flexible; diagnostic, including                        collection of specimen(s) by brushing or washing, when                        performed (separate procedure) Diagnosis Code(s):    --- Professional ---                       Z12.11, Encounter for screening for malignant neoplasm                        of colon                       K57.30, Diverticulosis of large intestine without                        perforation or abscess without bleeding CPT copyright 2016 [employer]. All rights reserved. The codes documented in this report are preliminary and upon coder review may  be revised to meet current compliance requirements. Christena Deem, MD 09/07/2015 4:56:04 PM This report has been signed electronically. Number of Addenda: 0 Note Initiated On: 09/07/2015 4:27 PM Scope Withdrawal Time: 0 hours 10 minutes 16 seconds  Total Procedure Duration: 0 hours 17 minutes 38 seconds       [facility]

## 2015-09-07 NOTE — Anesthesia Preprocedure Evaluation (Signed)
Anesthesia Evaluation  Patient identified by MRN, date of birth, ID band Patient awake    Reviewed: Allergy & Precautions, NPO status , Patient's Chart, lab work & pertinent test results  Airway Mallampati: II       Dental  (+) Teeth Intact   Pulmonary neg pulmonary ROS,     + decreased breath sounds      Cardiovascular hypertension, Pt. on home beta blockers + Past MI   Rhythm:Regular Rate:Normal     Neuro/Psych negative neurological ROS     GI/Hepatic negative GI ROS, Neg liver ROS,   Endo/Other  diabetes, Type 2  Renal/GU negative Renal ROS     Musculoskeletal   Abdominal (+) + obese,   Peds  Hematology   Anesthesia Other Findings   Reproductive/Obstetrics                             Anesthesia Physical Anesthesia Plan  ASA: III  Anesthesia Plan: General   Post-op Pain Management:    Induction: Intravenous  Airway Management Planned: Natural Airway and Nasal Cannula  Additional Equipment:   Intra-op Plan:   Post-operative Plan:   Informed Consent: I have reviewed the patients History and Physical, chart, labs and discussed the procedure including the risks, benefits and alternatives for the proposed anesthesia with the patient or authorized representative who has indicated his/her understanding and acceptance.     Plan Discussed with: CRNA  Anesthesia Plan Comments:         Anesthesia Quick Evaluation

## 2015-09-08 ENCOUNTER — Encounter: Payer: Self-pay | Admitting: Gastroenterology

## 2022-11-16 ENCOUNTER — Encounter: Payer: Self-pay | Admitting: Cardiology

## 2022-11-16 ENCOUNTER — Encounter
Admission: RE | Disposition: A | Discharge status: Home / Self Care | Payer: Self-pay | Source: Ambulatory Visit | Attending: Cardiology

## 2022-11-16 ENCOUNTER — Ambulatory Visit
Admission: RE | Admit: 2022-11-16 | Discharge: 2022-11-16 | Disposition: A | Discharge status: Home / Self Care | Payer: Medicare Other | Source: Ambulatory Visit | Attending: Cardiology | Admitting: Cardiology

## 2022-11-16 ENCOUNTER — Other Ambulatory Visit: Payer: Self-pay

## 2022-11-16 DIAGNOSIS — I2510 Atherosclerotic heart disease of native coronary artery without angina pectoris: Secondary | ICD-10-CM | POA: Diagnosis not present

## 2022-11-16 DIAGNOSIS — Z955 Presence of coronary angioplasty implant and graft: Secondary | ICD-10-CM | POA: Insufficient documentation

## 2022-11-16 DIAGNOSIS — I2582 Chronic total occlusion of coronary artery: Secondary | ICD-10-CM | POA: Diagnosis not present

## 2022-11-16 DIAGNOSIS — R079 Chest pain, unspecified: Secondary | ICD-10-CM | POA: Insufficient documentation

## 2022-11-16 DIAGNOSIS — Z01818 Encounter for other preprocedural examination: Secondary | ICD-10-CM

## 2022-11-16 HISTORY — PX: LEFT HEART CATH AND CORONARY ANGIOGRAPHY: CATH118249

## 2022-11-16 LAB — GLUCOSE, CAPILLARY: Glucose-Capillary: 128 mg/dL — ABNORMAL HIGH (ref 70–99)

## 2022-11-16 SURGERY — LEFT HEART CATH AND CORONARY ANGIOGRAPHY
Anesthesia: Moderate Sedation | Laterality: Left

## 2022-11-16 MED ORDER — ACETAMINOPHEN 325 MG PO TABS: 650.0000 mg | ORAL_TABLET | ORAL | Status: DC | PRN

## 2022-11-16 MED ORDER — SODIUM CHLORIDE 0.9% FLUSH: 3.0000 mL | Freq: Two times a day (BID) | INTRAVENOUS | Status: DC

## 2022-11-16 MED ORDER — LABETALOL HCL 5 MG/ML IV SOLN: 10.0000 mg | INTRAVENOUS | Status: DC | PRN

## 2022-11-16 MED ORDER — SODIUM CHLORIDE 0.9 % IV SOLN: 250.0000 mL | INTRAVENOUS | Status: DC | PRN

## 2022-11-16 MED ORDER — SODIUM CHLORIDE 0.9% FLUSH: 3.0000 mL | INTRAVENOUS | Status: DC | PRN

## 2022-11-16 MED ORDER — FENTANYL CITRATE (PF) 100 MCG/2ML IJ SOLN
INTRAMUSCULAR | Status: DC | PRN
  Administered 2022-11-16: 25 ug via INTRAVENOUS

## 2022-11-16 MED ORDER — SODIUM CHLORIDE 0.9 % WEIGHT BASED INFUSION: 93.0000 mL/h | INTRAVENOUS | Status: DC

## 2022-11-16 MED ORDER — VERAPAMIL HCL 2.5 MG/ML IV SOLN
INTRAVENOUS | Status: DC | PRN
  Administered 2022-11-16: 2.5 mg via INTRAVENOUS

## 2022-11-16 MED ORDER — IOHEXOL 300 MG/ML  SOLN
INTRAMUSCULAR | Status: DC | PRN
  Administered 2022-11-16: 85 mL

## 2022-11-16 MED ORDER — ASPIRIN 81 MG PO CHEW: 81.0000 mg | CHEWABLE_TABLET | ORAL | Status: DC

## 2022-11-16 MED ORDER — MIDAZOLAM HCL 2 MG/2ML IJ SOLN
INTRAMUSCULAR | Status: AC
  Filled 2022-11-16: qty 2

## 2022-11-16 MED ORDER — ONDANSETRON HCL 4 MG/2ML IJ SOLN: 4.0000 mg | Freq: Four times a day (QID) | INTRAMUSCULAR | Status: DC | PRN

## 2022-11-16 MED ORDER — FENTANYL CITRATE (PF) 100 MCG/2ML IJ SOLN
INTRAMUSCULAR | Status: AC
  Filled 2022-11-16: qty 2

## 2022-11-16 MED ORDER — HEPARIN SODIUM (PORCINE) 1000 UNIT/ML IJ SOLN
INTRAMUSCULAR | Status: AC
  Filled 2022-11-16: qty 10

## 2022-11-16 MED ORDER — MIDAZOLAM HCL 2 MG/2ML IJ SOLN
INTRAMUSCULAR | Status: DC | PRN
  Administered 2022-11-16: 1 mg via INTRAVENOUS

## 2022-11-16 MED ORDER — SODIUM CHLORIDE 0.9 % WEIGHT BASED INFUSION
280.0000 mL/h | INTRAVENOUS | Status: AC
  Administered 2022-11-16: 280 mL/h via INTRAVENOUS

## 2022-11-16 MED ORDER — HYDRALAZINE HCL 20 MG/ML IJ SOLN: 10.0000 mg | INTRAMUSCULAR | Status: DC | PRN

## 2022-11-16 MED ORDER — HEPARIN SODIUM (PORCINE) 1000 UNIT/ML IJ SOLN
INTRAMUSCULAR | Status: DC | PRN
  Administered 2022-11-16: 4000 [IU] via INTRAVENOUS

## 2022-11-16 MED ORDER — SODIUM CHLORIDE 0.9 % WEIGHT BASED INFUSION: 1.0000 mL/kg/h | INTRAVENOUS | Status: DC

## 2022-11-16 MED ORDER — VERAPAMIL HCL 2.5 MG/ML IV SOLN
INTRAVENOUS | Status: AC
  Filled 2022-11-16: qty 2

## 2022-11-16 MED ORDER — HEPARIN (PORCINE) IN NACL 1000-0.9 UT/500ML-% IV SOLN
INTRAVENOUS | Status: AC
  Filled 2022-11-16: qty 1000

## 2022-11-16 MED ORDER — HEPARIN (PORCINE) IN NACL 2000-0.9 UNIT/L-% IV SOLN
INTRAVENOUS | Status: DC | PRN
  Administered 2022-11-16: 1000 mL

## 2022-11-16 SURGICAL SUPPLY — 10 items
CATH INFINITI AMBI 5FR TG (CATHETERS) IMPLANT
DEVICE RAD TR BAND REGULAR (VASCULAR PRODUCTS) IMPLANT
DRAPE BRACHIAL (DRAPES) IMPLANT
GLIDESHEATH SLEND SS 6F .021 (SHEATH) IMPLANT
GUIDEWIRE INQWIRE 1.5J.035X260 (WIRE) IMPLANT
INQWIRE 1.5J .035X260CM (WIRE) ×1
PACK CARDIAC CATH (CUSTOM PROCEDURE TRAY) ×1 IMPLANT
PROTECTION STATION PRESSURIZED (MISCELLANEOUS) ×1
SET ATX-X65L (MISCELLANEOUS) IMPLANT
STATION PROTECTION PRESSURIZED (MISCELLANEOUS) IMPLANT
